# Patient Record
Sex: Female | Born: 1960 | Race: White | Hispanic: No | Marital: Married | Smoking: Never smoker
Health system: Southern US, Community
[De-identification: ages and names within clinical notes are randomized; demographics above are authoritative.]

---

## 2019-05-08 ENCOUNTER — Telehealth: Payer: Self-pay | Admitting: Orthopedic Surgery

## 2019-05-08 NOTE — Telephone Encounter (Signed)
Spoke with patient upon receipt of records as requested, received in fax. Appointment has been scheduled; patient aware; also aware to bring film.

## 2019-05-08 NOTE — Telephone Encounter (Signed)
Patient called to inquire about scheduling an appointment for left foot injury which she states occurred on 05/01/19 while out of town. States was treated at [facility] in [place], [place].  Relays she was told she may have torn ligaments, and that she was given a boot and crutches.  Created patient in [facility], as she is new to our system and our area.  States she has the films (CD) and will request provider's notes and imaging report.  Appointment pending.

## 2019-05-09 ENCOUNTER — Other Ambulatory Visit: Payer: Self-pay

## 2019-05-09 ENCOUNTER — Ambulatory Visit: Payer: No Typology Code available for payment source | Admitting: Orthopaedic Surgery

## 2019-05-09 ENCOUNTER — Encounter: Payer: Self-pay | Admitting: Orthopaedic Surgery

## 2019-05-09 VITALS — BP 155/83 | HR 70 | Ht 68.0 in | Wt 170.0 lb

## 2019-05-09 DIAGNOSIS — S96912A Strain of unspecified muscle and tendon at ankle and foot level, left foot, initial encounter: Secondary | ICD-10-CM | POA: Diagnosis not present

## 2019-05-09 MED ORDER — TRAMADOL HCL 50 MG PO TABS: 50.0000 mg | ORAL_TABLET | Freq: Four times a day (QID) | ORAL | 0 refills | Status: AC | PRN

## 2019-05-09 NOTE — Patient Instructions (Addendum)
Use contrast baths and elevate your ankle   Once it feels a bit better and you are able to touch it Use Aspercreme, Biofreeze or Voltaren gel over the counter 2-3 times daily make sure you rub it in well each time you use it.

## 2019-05-09 NOTE — Progress Notes (Signed)
Subjective:    Patient ID: Shari Larson, female    DOB: 07/31/1960, 59 y.o.   MRN: [id number]  HPI She was in [place] at a park and missed a step on a walkway.  She fell and hurt her left ankle, right knee and left elbow.  She was seen at [facility] in [place], [place] on 05-01-2019, the date of injury.  I have reviewed the notes, the x-rays and report.  I have independently reviewed and interpreted x-rays of this patient done at another site by another physician or qualified health professional.  She has lateral soft tissue edema but no fracture.  Ankle mortise is normal.    She was given crutches, a CAM walker, oxycodone which made her sick.  She is doing better. The right knee and left elbow are OK. She complains of pain of the lateral left ankle.  She has taken ibuprofen with some slight help.  She has elevated the foot and ankle on the left.   Review of Systems  Constitutional: Positive for activity change.  Musculoskeletal: Positive for arthralgias, gait problem and joint swelling.  All other systems reviewed and are negative.  For Review of Systems, all other systems reviewed and are negative.  The following is a summary of the past history medically, past history surgically, known current medicines, social history and family history.  This information is gathered electronically by the computer from prior information and documentation.  I review this each visit and have found including this information at this point in the chart is beneficial and informative.   History reviewed. No pertinent past medical history.  History reviewed. No pertinent surgical history.  Current Outpatient Medications on File Prior to Visit  Medication Sig Dispense Refill  . amphetamine-dextroamphetamine (ADDERALL) 15 MG tablet Take 15 mg by mouth 2 (two) times daily.    . DULoxetine (CYMBALTA) 60 MG capsule Take 60 mg by mouth 2 (two) times daily.     No current  facility-administered medications on file prior to visit.    Social History   Socioeconomic History  . Marital status: Married    Spouse name: Not on file  . Number of children: Not on file  . Years of education: Not on file  . Highest education level: Not on file  Occupational History  . Not on file  Tobacco Use  . Smoking status: Never Smoker  . Smokeless tobacco: Never Used  Substance and Sexual Activity  . Alcohol use: Not on file  . Drug use: Not on file  . Sexual activity: Not on file  Other Topics Concern  . Not on file  Social History Narrative  . Not on file   Social Determinants of Health   Financial Resource Strain:   . Difficulty of Paying Living Expenses: Not on file  Food Insecurity:   . Worried About Programme researcher, broadcasting/film/video in the Last Year: Not on file  . Ran Out of Food in the Last Year: Not on file  Transportation Needs:   . Lack of Transportation (Medical): Not on file  . Lack of Transportation (Non-Medical): Not on file  Physical Activity:   . Days of Exercise per Week: Not on file  . Minutes of Exercise per Session: Not on file  Stress:   . Feeling of Stress : Not on file  Social Connections:   . Frequency of Communication with Friends and Family: Not on file  . Frequency of Social  Gatherings with Friends and Family: Not on file  . Attends Religious Services: Not on file  . Active Member of Clubs or Organizations: Not on file  . Attends Archivist Meetings: Not on file  . Marital Status: Not on file  Intimate Partner Violence:   . Fear of Current or Ex-Partner: Not on file  . Emotionally Abused: Not on file  . Physically Abused: Not on file  . Sexually Abused: Not on file    Family History  Problem Relation Age of Onset  . Healthy Mother   . Healthy Father     BP (!) 155/83   Pulse 70   Ht 5\' 8"  (1.727 m)   Wt 170 lb (77.1 kg)   BMI 25.85 kg/m   Body mass index is 25.85 kg/m.      Objective:   Physical Exam Vitals  and nursing note reviewed.  Constitutional:      Appearance: She is well-developed.  HENT:     Head: Normocephalic and atraumatic.  Eyes:     Conjunctiva/sclera: Conjunctivae normal.     Pupils: Pupils are equal, round, and reactive to light.  Cardiovascular:     Rate and Rhythm: Normal rate and regular rhythm.  Pulmonary:     Effort: Pulmonary effort is normal.  Abdominal:     Palpations: Abdomen is soft.  Musculoskeletal:     Cervical back: Normal range of motion and neck supple.       Feet:  Skin:    General: Skin is warm and dry.  Neurological:     Mental Status: She is alert and oriented to person, place, and time.     Cranial Nerves: No cranial nerve deficit.     Motor: No abnormal muscle tone.     Coordination: Coordination normal.     Deep Tendon Reflexes: Reflexes are normal and symmetric. Reflexes normal.  Psychiatric:        Behavior: Behavior normal.        Thought Content: Thought content normal.        Judgment: Judgment normal.           Assessment & Plan:   Encounter Diagnosis  Name Primary?  . Strain of left ankle and foot, initial encounter Yes   I have told her to continue the CAM walker.    I have given instructions for contrast baths.  I will give Ultram for pain.  She could not take the oxycodone.  Elevate and ice.  Use the crutches.  Return in two weeks.  It may take four to six weeks to recover.  Call if any problem.  Precautions discussed.  I have reviewed the [employer] web site prior to prescribing narcotic medicine for this patient.      Electronically Signed Sanjuana Kava, MD 1/26/20212:04 PM

## 2019-05-23 ENCOUNTER — Ambulatory Visit: Payer: No Typology Code available for payment source | Admitting: Orthopaedic Surgery

## 2021-01-31 ENCOUNTER — Other Ambulatory Visit (HOSPITAL_COMMUNITY): Payer: Self-pay | Admitting: Internal Medicine

## 2021-01-31 DIAGNOSIS — Z1231 Encounter for screening mammogram for malignant neoplasm of breast: Secondary | ICD-10-CM

## 2021-02-10 ENCOUNTER — Other Ambulatory Visit: Payer: Self-pay

## 2021-02-10 ENCOUNTER — Ambulatory Visit (HOSPITAL_COMMUNITY)
Admission: RE | Admit: 2021-02-10 | Discharge: 2021-02-10 | Disposition: A | Discharge status: Home / Self Care | Payer: No Typology Code available for payment source | Source: Ambulatory Visit | Attending: Internal Medicine | Admitting: Internal Medicine

## 2021-02-10 DIAGNOSIS — Z1231 Encounter for screening mammogram for malignant neoplasm of breast: Secondary | ICD-10-CM | POA: Insufficient documentation

## 2021-02-11 ENCOUNTER — Inpatient Hospital Stay
Admission: RE | Admit: 2021-02-11 | Discharge: 2021-02-11 | Disposition: A | Discharge status: Home / Self Care | Payer: Self-pay | Source: Ambulatory Visit | Attending: Internal Medicine | Admitting: Internal Medicine

## 2021-02-11 ENCOUNTER — Other Ambulatory Visit (HOSPITAL_COMMUNITY): Payer: Self-pay | Admitting: Internal Medicine

## 2021-02-11 DIAGNOSIS — Z1231 Encounter for screening mammogram for malignant neoplasm of breast: Secondary | ICD-10-CM

## 2021-08-19 ENCOUNTER — Ambulatory Visit (INDEPENDENT_AMBULATORY_CARE_PROVIDER_SITE_OTHER): Payer: No Typology Code available for payment source

## 2021-08-19 ENCOUNTER — Ambulatory Visit (INDEPENDENT_AMBULATORY_CARE_PROVIDER_SITE_OTHER): Payer: No Typology Code available for payment source | Admitting: Physician Assistant

## 2021-08-19 DIAGNOSIS — M5441 Lumbago with sciatica, right side: Secondary | ICD-10-CM | POA: Diagnosis not present

## 2021-08-19 DIAGNOSIS — M5431 Sciatica, right side: Secondary | ICD-10-CM | POA: Diagnosis not present

## 2021-08-19 MED ORDER — METHYLPREDNISOLONE 4 MG PO TBPK: ORAL_TABLET | ORAL | 1 refills | Status: AC

## 2021-08-19 NOTE — Progress Notes (Signed)
? ?Office Visit Note ?  ?Patient: Shari Larson           ?Date of Birth: [birth year]           ?MRN: [id number] ?Visit Date: 08/19/2021 ?             ?Requested by: Benita Stabile, MD ?[address] ?[address] [place] ?[place],  [place] [address] ?PCP: Benita Stabile, MD ? ?Chief Complaint  ?Patient presents with  ? Lower Back - Pain  ?  Pain right lower leg, buttock and lateral hip to mid-thigh x 1 month  ? ? ? ? ?HPI: ?Patient is a pleasant 61 year old woman with a 1 month history of right lower back pain that starts in her posterior right buttock and radiates down to the lateral side of her thigh.  Sometimes the thought for feels funny.  She denies any weakness any loss of bowel or bladder control any paresthesias.  She recalls having similar symptoms when she was pregnant with one of her children.  She has tried over-the-counter anti-inflammatories and a pain patch.  She thinks what triggered this was working on power washing getting  ? ?Assessment & Plan: ?Visit Diagnoses:  ?1. Acute right-sided low back pain with right-sided sciatica   ? ? ?Plan: Findings consistent with sciatica.  She is quite uncomfortable.  We will go forward and order a steroid taper pack.  She knows not to take this with other anti-inflammatories can follow-up if needed. ? ?Follow-Up Instructions: No follow-ups on file.  ? ?Ortho Exam ? ?Patient is alert, oriented, no adenopathy, well-dressed, normal affect, normal respiratory effort. ?Examination pleasant 61 year old woman no distress.  She has good side to side bending forward back flexing.  She does have take pain to palpation deeply over the posterior buttocks.  Has some radiation of symptoms down to her knee laterally.  She has 5 out of 5 strength with resisted dorsiflexion plantarflexion of her ankles resisted extension and flexion of her knees and flexion of her hips.  Sensation is intact ? ?Imaging: ?No results found. ?No images are attached to the encounter. ? ?Labs: ?No results found for: HGBA1C,  ESRSEDRATE, CRP, LABURIC, REPTSTATUS, GRAMSTAIN, CULT, LABORGA ? ? ?No results found for: ALBUMIN, PREALBUMIN, CBC ? ?No results found for: MG ?No results found for: VD25OH ? ?No results found for: PREALBUMIN ?   ? View : No data to display.  ?  ?  ?  ? ? ? ?There is no height or weight on file to calculate BMI. ? ?Orders:  ?Orders Placed This Encounter  ?Procedures  ? XR Lumbar Spine 2-3 Views  ? ?Meds ordered this encounter  ?Medications  ? methylPREDNISolone (MEDROL DOSEPAK) 4 MG TBPK tablet  ?  Sig: Take as directed  ?  Dispense:  21 tablet  ?  Refill:  1  ? ? ? Procedures: ?No procedures performed ? ?Clinical Data: ?No additional findings. ? ?ROS: ? ?All other systems negative, except as noted in the HPI. ?Review of Systems ? ?Objective: ?Vital Signs: There were no vitals taken for this visit. ? ?Specialty Comments:  ?No specialty comments available. ? ?PMFS History: ?There are no problems to display for this patient. ? ?No past medical history on file.  ?Family History  ?Problem Relation Age of Onset  ? Healthy Mother   ? Healthy Father   ?  ?No past surgical history on file. ?Social History  ? ?Occupational History  ? Not on file  ?Tobacco Use  ? Smoking  status: Never  ? Smokeless tobacco: Never  ?Substance and Sexual Activity  ? Alcohol use: Not on file  ? Drug use: Not on file  ? Sexual activity: Not on file  ? ? ? ? ? ?

## 2021-09-09 ENCOUNTER — Telehealth: Payer: Self-pay | Admitting: Physician Assistant

## 2021-09-09 ENCOUNTER — Other Ambulatory Visit: Payer: Self-pay

## 2021-09-09 ENCOUNTER — Other Ambulatory Visit: Payer: Self-pay | Admitting: Physician Assistant

## 2021-09-09 DIAGNOSIS — M5441 Lumbago with sciatica, right side: Secondary | ICD-10-CM

## 2021-09-09 MED ORDER — TIZANIDINE HCL 4 MG PO TABS: 4.0000 mg | ORAL_TABLET | Freq: Four times a day (QID) | ORAL | 0 refills | Status: AC | PRN

## 2021-09-09 NOTE — Telephone Encounter (Signed)
Patient called. Says the pain is back and it is bad. Would like to know what her next steps are? Her call back number is [phone]

## 2021-09-09 NOTE — Telephone Encounter (Signed)
Ordered

## 2021-09-09 NOTE — Telephone Encounter (Signed)
Spoke with patient. She is insistent on an injection. She does not want an MRI because she is self pay, but also is very insistent that she was told at the last visit she would be able to just get an injection. No MRI needed.

## 2021-10-01 ENCOUNTER — Ambulatory Visit (HOSPITAL_COMMUNITY)
Admission: RE | Admit: 2021-10-01 | Discharge: 2021-10-01 | Disposition: A | Discharge status: Home / Self Care | Payer: No Typology Code available for payment source | Source: Ambulatory Visit | Attending: Physician Assistant | Admitting: Physician Assistant

## 2021-10-01 DIAGNOSIS — M5441 Lumbago with sciatica, right side: Secondary | ICD-10-CM | POA: Insufficient documentation

## 2021-10-03 ENCOUNTER — Other Ambulatory Visit: Payer: Self-pay | Admitting: Physician Assistant

## 2021-10-03 ENCOUNTER — Other Ambulatory Visit: Payer: Self-pay

## 2021-10-03 DIAGNOSIS — M5441 Lumbago with sciatica, right side: Secondary | ICD-10-CM

## 2021-10-07 ENCOUNTER — Ambulatory Visit
Admission: RE | Admit: 2021-10-07 | Discharge: 2021-10-07 | Disposition: A | Discharge status: Home / Self Care | Payer: No Typology Code available for payment source | Source: Ambulatory Visit | Attending: Physician Assistant | Admitting: Physician Assistant

## 2021-10-07 DIAGNOSIS — M5441 Lumbago with sciatica, right side: Secondary | ICD-10-CM

## 2021-10-07 MED ORDER — IOPAMIDOL (ISOVUE-M 200) INJECTION 41%
1.0000 mL | Freq: Once | INTRAMUSCULAR | Status: AC
  Administered 2021-10-07: 1 mL via EPIDURAL

## 2021-10-07 MED ORDER — METHYLPREDNISOLONE ACETATE 40 MG/ML INJ SUSP (RADIOLOG
80.0000 mg | Freq: Once | INTRAMUSCULAR | Status: AC
  Administered 2021-10-07: 80 mg via EPIDURAL

## 2021-11-19 ENCOUNTER — Ambulatory Visit: Payer: No Typology Code available for payment source | Admitting: Orthopaedic Surgery

## 2021-12-05 ENCOUNTER — Telehealth: Payer: Self-pay | Admitting: Physician Assistant

## 2021-12-05 DIAGNOSIS — M5441 Lumbago with sciatica, right side: Secondary | ICD-10-CM

## 2021-12-05 NOTE — Telephone Encounter (Signed)
Patient called. Would like a rx for an injection to [facility] imaging. Her call back number is [phone]

## 2021-12-08 NOTE — Telephone Encounter (Signed)
Referral for injection has been placed for the patient at [facility] imaging.

## 2021-12-10 ENCOUNTER — Ambulatory Visit
Admission: RE | Admit: 2021-12-10 | Discharge: 2021-12-10 | Disposition: A | Discharge status: Home / Self Care | Payer: No Typology Code available for payment source | Source: Ambulatory Visit | Attending: Physician Assistant | Admitting: Physician Assistant

## 2021-12-10 DIAGNOSIS — M5441 Lumbago with sciatica, right side: Secondary | ICD-10-CM

## 2021-12-10 MED ORDER — METHYLPREDNISOLONE ACETATE 40 MG/ML INJ SUSP (RADIOLOG
80.0000 mg | Freq: Once | INTRAMUSCULAR | Status: AC
  Administered 2021-12-10: 80 mg via EPIDURAL

## 2021-12-10 MED ORDER — IOPAMIDOL (ISOVUE-M 200) INJECTION 41%
1.0000 mL | Freq: Once | INTRAMUSCULAR | Status: AC
  Administered 2021-12-10: 1 mL via EPIDURAL

## 2021-12-10 NOTE — Discharge Instructions (Signed)

## 2021-12-31 ENCOUNTER — Ambulatory Visit: Payer: No Typology Code available for payment source | Admitting: Orthopaedic Surgery

## 2022-01-21 ENCOUNTER — Ambulatory Visit: Payer: No Typology Code available for payment source | Admitting: Orthopaedic Surgery

## 2022-01-29 ENCOUNTER — Encounter: Payer: Self-pay | Admitting: *Deleted

## 2022-02-17 ENCOUNTER — Ambulatory Visit: Payer: No Typology Code available for payment source | Admitting: Orthopaedic Surgery

## 2022-03-09 ENCOUNTER — Ambulatory Visit (HOSPITAL_BASED_OUTPATIENT_CLINIC_OR_DEPARTMENT_OTHER): Payer: No Typology Code available for payment source | Admitting: Cardiovascular Disease

## 2022-04-01 ENCOUNTER — Other Ambulatory Visit (HOSPITAL_COMMUNITY): Payer: Self-pay | Admitting: Internal Medicine

## 2022-04-01 DIAGNOSIS — Z1231 Encounter for screening mammogram for malignant neoplasm of breast: Secondary | ICD-10-CM

## 2022-04-08 ENCOUNTER — Ambulatory Visit (HOSPITAL_COMMUNITY)
Admission: RE | Admit: 2022-04-08 | Discharge: 2022-04-08 | Disposition: A | Discharge status: Home / Self Care | Payer: No Typology Code available for payment source | Source: Ambulatory Visit | Attending: Internal Medicine | Admitting: Internal Medicine

## 2022-04-08 ENCOUNTER — Encounter (HOSPITAL_COMMUNITY): Payer: Self-pay | Admitting: Radiology

## 2022-04-08 DIAGNOSIS — Z1231 Encounter for screening mammogram for malignant neoplasm of breast: Secondary | ICD-10-CM | POA: Diagnosis not present

## 2022-07-13 ENCOUNTER — Encounter: Payer: Self-pay | Admitting: *Deleted

## 2023-11-29 IMAGING — MR MR LUMBAR SPINE W/O CM
5 series · 30 of 48 positions shown · non-contrast
Comparison: No prior MRI, correlation is made with lumbar spine
radiographs 08/19/2021

CLINICAL DATA: Right-sided low back pain and right-sided sciatica

EXAM:
MRI LUMBAR SPINE WITHOUT CONTRAST
TECHNIQUE: Multiplanar, multisequence MR imaging of the lumbar spine was
performed. No intravenous contrast was administered.

[Series 5: T2 · sagittal · 4.0mm · 0.68mm/px · 6 of 15 slices shown (1 of 2)]
[im 1/15]
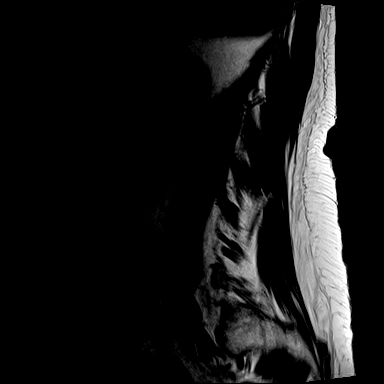
[im 3/15]
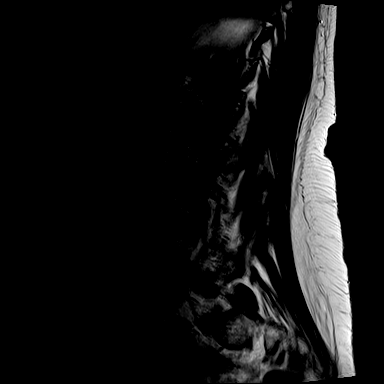
[im 6/15]
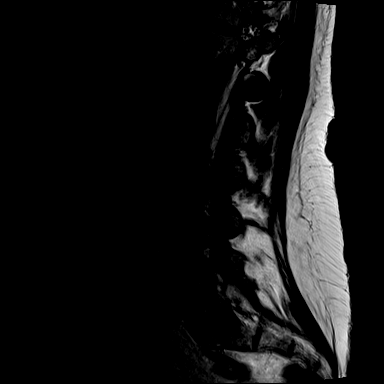
[im 9/15]
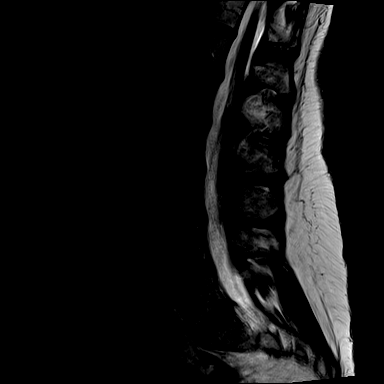
[im 12/15]
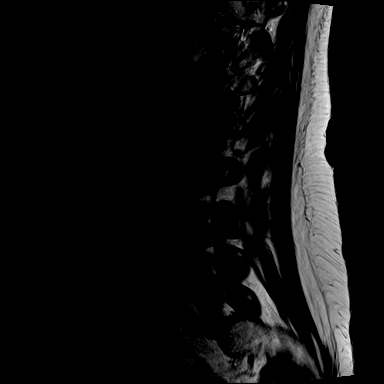
[im 15/15]
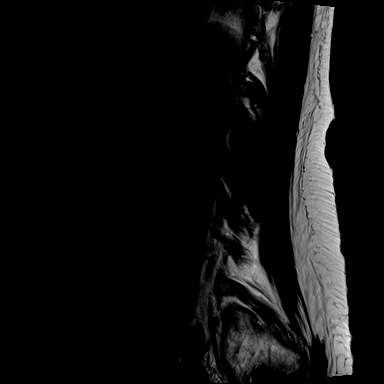

[Series 6: T1 · sagittal · 4.0mm · 0.81mm/px · 7 of 15 slices shown (1 of 2)]
[im 1/15]
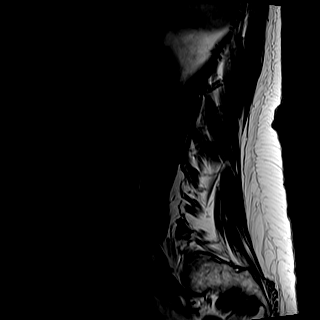
[im 3/15]
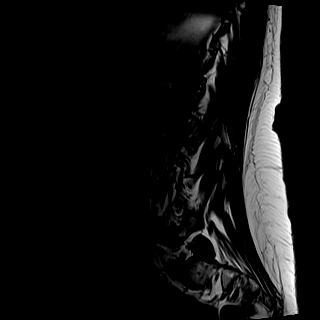
[im 5/15]
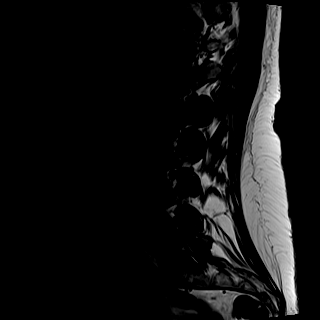
[im 8/15]
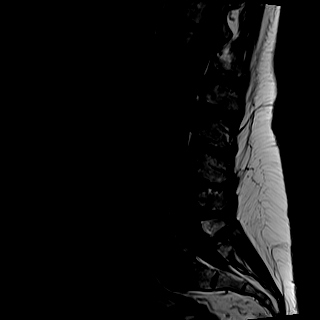
[im 10/15]
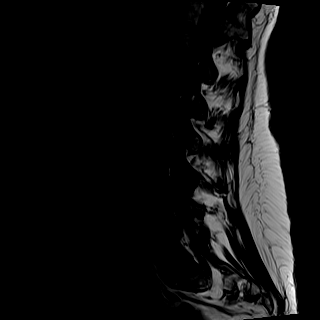
[im 12/15]
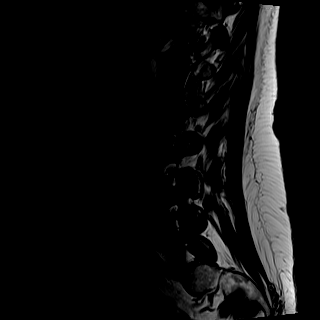
[im 15/15]
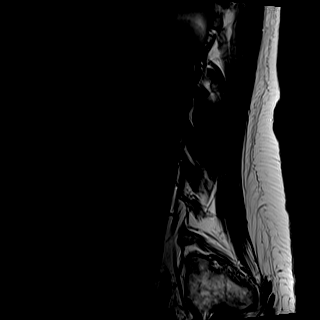

[Series 7: STIR · sagittal · 4.0mm · 0.51mm/px · 1 of 15 slices shown]
[im 1/15]
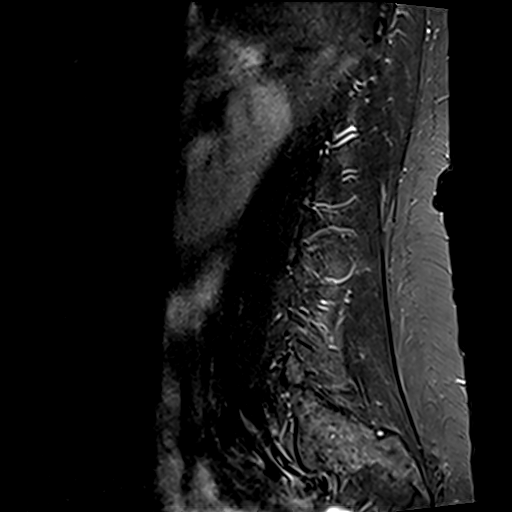

[Series 8: T2 · axial · 4.0mm · 0.70mm/px · z∈[-123,+62]mm · 8 of 32 slices shown (2 of 2)]
[im 1/32]
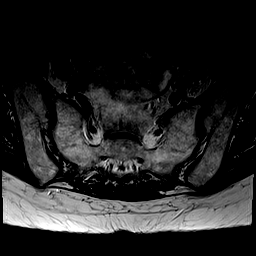
[im 5/32]
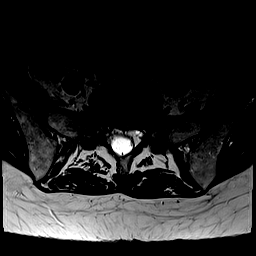
[im 10/32]
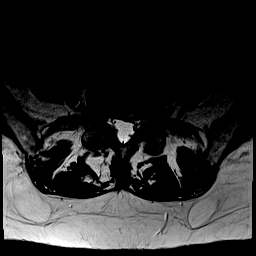
[im 15/32]
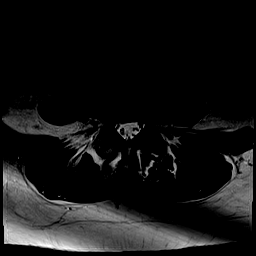
[im 17/32]
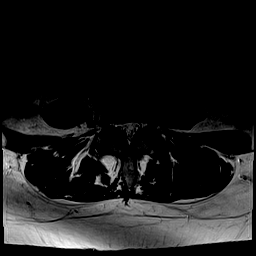
[im 22/32]
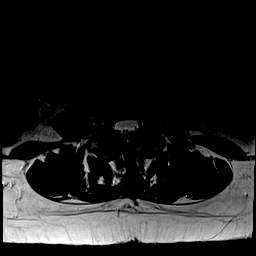
[im 27/32]
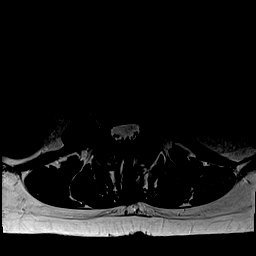
[im 32/32]
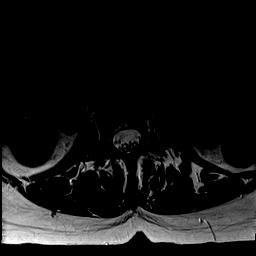

[Series 9: T1 · axial · 4.0mm · 0.35mm/px · z∈[-123,+62]mm · 8 of 32 slices shown (2 of 2)]
[im 1/32]
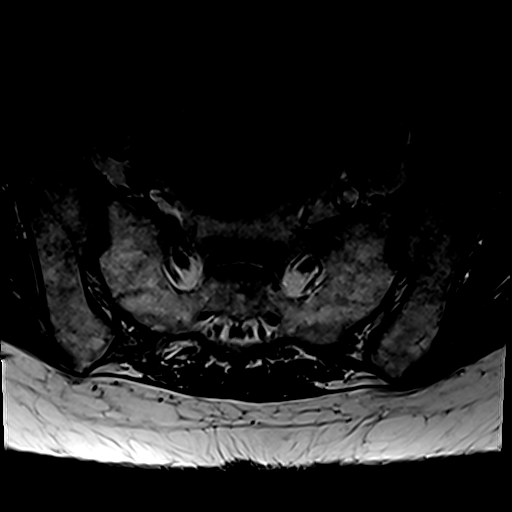
[im 5/32]
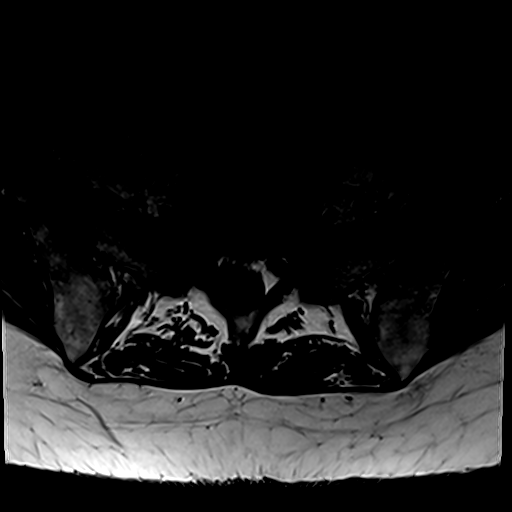
[im 10/32]
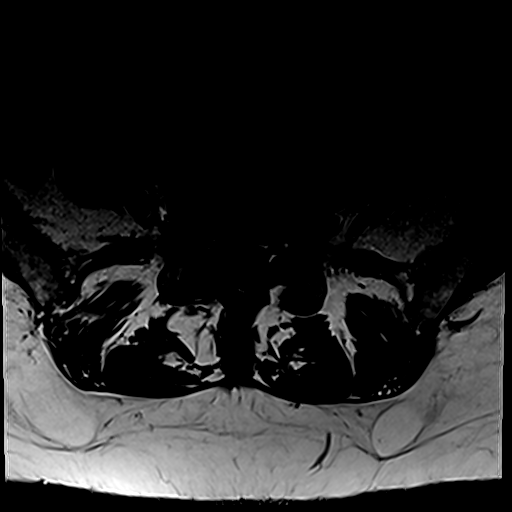
[im 15/32]
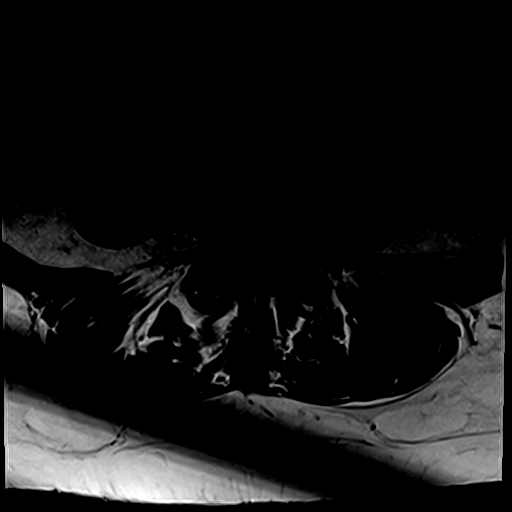
[im 17/32]
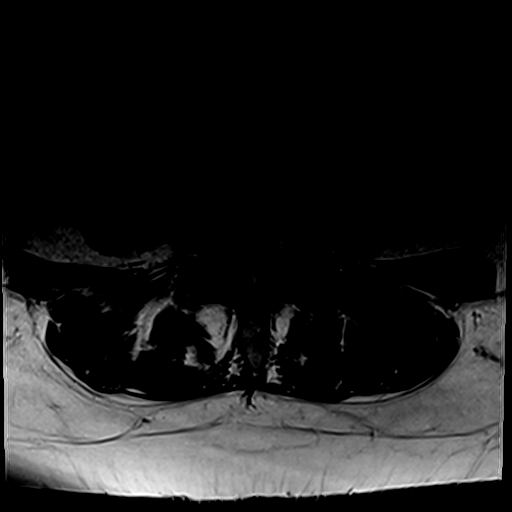
[im 22/32]
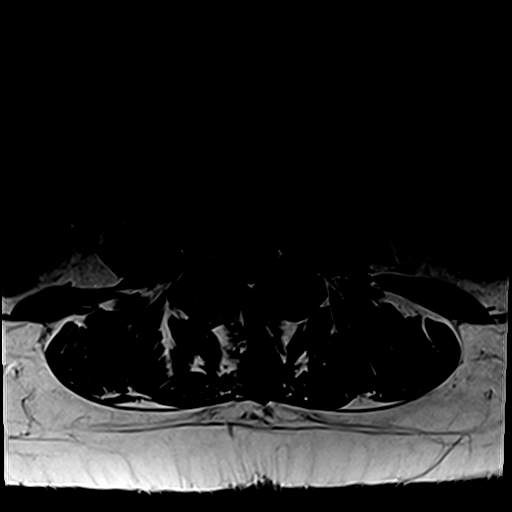
[im 27/32]
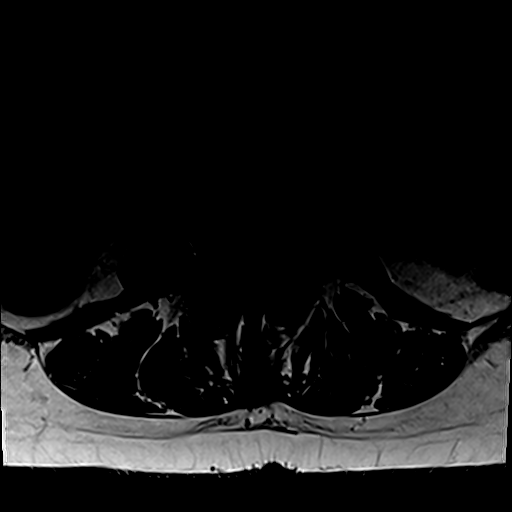
[im 32/32]
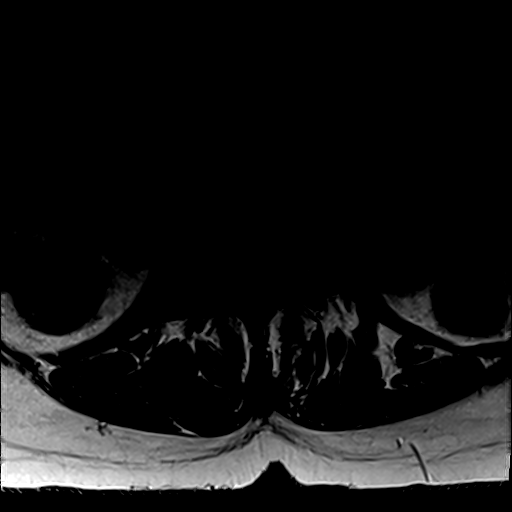

[30 of 48 positions shown; findings below may reference images not displayed]

FINDINGS: Evaluation is somewhat limited by motion artifact.

Segmentation: 6 lumbar-type vertebral bodies, the lowest of which is
labeled L6, with the last well-developed disc space at L6-S1.

Alignment: S shaped curvature of the thoracolumbar spine, with
dextrocurvature of the upper lumbar spine and compensatory
levocurvature of the lower lumbar spine.

Vertebrae: No acute fracture or suspicious osseous lesion. Vertebral
body heights are preserved.

Conus medullaris and cauda equina: Conus extends to the L2 level.
Conus and cauda equina appear normal.

Paraspinal and other soft tissues: Possible horseshoe kidney,
incompletely evaluated.

Disc levels:

T12-L1: Seen only on the sagittal images. No significant disc bulge,
spinal canal stenosis, or neural foraminal narrowing.

L1-L2: Seen only on the sagittal images. Minimal disc bulge. No
spinal canal stenosis or neural foraminal narrowing.

L2-L3: Minimal disc bulge. Mild facet arthropathy. No spinal canal
stenosis or neural foraminal narrowing.

L3-L4: Minimal disc bulge, somewhat eccentric to the left. Mild
facet arthropathy. No spinal canal stenosis or neural foraminal
narrowing.

L4-L5: Mild disc bulge with right foraminal protrusion. Mild facet
arthropathy. Narrowing of the right lateral recess. No spinal canal
stenosis or neural foraminal narrowing.

L5-L6: Right foraminal/extreme lateral protrusion. Mild facet
arthropathy. Narrowing of the right lateral recess. No spinal canal
stenosis. Mild left and moderate right neural foraminal narrowing.

L6-S1: Minimal disc bulge. No spinal canal stenosis or neural
foraminal narrowing.
IMPRESSION: 1. Six lumbar-type vertebral bodies, the lowest of which is labeled
L6. Please correlate with imaging if any intervention is planned.
2. L5-L6 moderate right and mild left neural foraminal narrowing.
Narrowing of the right lateral recess at this level could affect the
descending right L6 nerve root.
3. Narrowing of the right lateral recesses at L4-L5, which could
affect the descending right L5 nerve root.
4. Horseshoe kidney suspected.
